# Patient Record
Sex: Male | Born: 1990 | Race: Black or African American | Hispanic: No | Marital: Single | Smoking: Current some day smoker
Health system: Southern US, Community
[De-identification: ages and names within clinical notes are randomized; demographics above are authoritative.]

---

## 2017-01-17 ENCOUNTER — Emergency Department (HOSPITAL_BASED_OUTPATIENT_CLINIC_OR_DEPARTMENT_OTHER)
Admission: EM | Admit: 2017-01-17 | Discharge: 2017-01-17 | Disposition: A | Discharge status: Home / Self Care | Payer: BLUE CROSS/BLUE SHIELD | Attending: Emergency Medicine | Admitting: Emergency Medicine

## 2017-01-17 ENCOUNTER — Encounter (HOSPITAL_BASED_OUTPATIENT_CLINIC_OR_DEPARTMENT_OTHER): Payer: Self-pay | Admitting: Emergency Medicine

## 2017-01-17 ENCOUNTER — Emergency Department (HOSPITAL_BASED_OUTPATIENT_CLINIC_OR_DEPARTMENT_OTHER): Payer: BLUE CROSS/BLUE SHIELD

## 2017-01-17 DIAGNOSIS — W19XXXA Unspecified fall, initial encounter: Secondary | ICD-10-CM | POA: Insufficient documentation

## 2017-01-17 DIAGNOSIS — S59902A Unspecified injury of left elbow, initial encounter: Secondary | ICD-10-CM | POA: Diagnosis present

## 2017-01-17 DIAGNOSIS — S5002XA Contusion of left elbow, initial encounter: Secondary | ICD-10-CM | POA: Diagnosis not present

## 2017-01-17 DIAGNOSIS — Y998 Other external cause status: Secondary | ICD-10-CM | POA: Insufficient documentation

## 2017-01-17 DIAGNOSIS — Y929 Unspecified place or not applicable: Secondary | ICD-10-CM | POA: Diagnosis not present

## 2017-01-17 DIAGNOSIS — Y9367 Activity, basketball: Secondary | ICD-10-CM | POA: Diagnosis not present

## 2017-01-17 MED ORDER — IBUPROFEN 400 MG PO TABS
600.0000 mg | ORAL_TABLET | Freq: Once | ORAL | Status: AC
  Administered 2017-01-17: 02:00:00 600 mg via ORAL
  Filled 2017-01-17: qty 1

## 2017-01-17 NOTE — ED Provider Notes (Signed)
[facility] Provider Note   CSN: [id number] Arrival date & time: 01/17/17  0038     History   Chief Complaint Chief Complaint  Patient presents with  . Elbow Injury    HPI Donald Rogers is a 26 y.o. male  with no significant past medical history presenting today with abdominal pain. Patient states last night around 6 PM he fell on his left elbow while playing well. He has pain over the lateral aspect of the elbow. He is concerned that there is a dislocation. He states he does not think it's broken because he has had broken bones before and it does not hurt that badly. He has not taken any medication for this. He denies hitting his head or loss of consciousness. There are no further complaints.  10 Systems reviewed and are negative for acute change except as noted in the HPI.    HPI  History reviewed. No pertinent past medical history.  There are no active problems to display for this patient.   History reviewed. No pertinent surgical history.     Home Medications    Prior to Admission medications   Not on File    Family History No family history on file.  Social History Social History  Substance Use Topics  . Smoking status: Never Smoker  . Smokeless tobacco: Never Used  . Alcohol use No     Allergies   Patient has no known allergies.   Review of Systems Review of Systems   Physical Exam Updated Vital Signs BP 146/98 (BP Location: Right Arm)   Pulse 64   Temp 98.2 F (36.8 C) (Oral)   Resp 16   SpO2 99%   Physical Exam  Constitutional: He is oriented to person, place, and time. Vital signs are normal. He appears well-developed and well-nourished.  Non-toxic appearance. He does not appear ill. No distress.  HENT:  Head: Normocephalic and atraumatic.  Nose: Nose normal.  Mouth/Throat: Oropharynx is clear and moist. No oropharyngeal exudate.  Eyes: Conjunctivae and EOM are normal. Pupils are equal, round, and reactive to light. No  scleral icterus.  Neck: Normal range of motion. Neck supple. No tracheal deviation, no edema, no erythema and normal range of motion present. No thyroid mass and no thyromegaly present.  Cardiovascular: Normal rate, regular rhythm, S1 normal, S2 normal, normal heart sounds, intact distal pulses and normal pulses.  Exam reveals no gallop and no friction rub.   No murmur heard. Pulmonary/Chest: Effort normal and breath sounds normal. No respiratory distress. He has no wheezes. He has no rhonchi. He has no rales.  Abdominal: Soft. Normal appearance and bowel sounds are normal. He exhibits no distension, no ascites and no mass. There is no hepatosplenomegaly. There is no tenderness. There is no rebound, no guarding and no CVA tenderness.  Musculoskeletal: He exhibits tenderness. He exhibits no edema or deformity.  Limited range of motion of left elbow due to pain. He cannot fully extend or flex. No swelling seen. No significant tenderness to palpation.  Lymphadenopathy:    He has no cervical adenopathy.  Neurological: He is alert and oriented to person, place, and time. He has normal strength. No cranial nerve deficit or sensory deficit.  Skin: Skin is warm, dry and intact. No petechiae and no rash noted. He is not diaphoretic. No erythema. No pallor.  Nursing note and vitals reviewed.    ED Treatments / Results  Labs (all labs ordered are listed, but only abnormal results are displayed)  Labs Reviewed - No data to display  EKG  EKG Interpretation None       Radiology Dg Elbow Complete Left  Result Date: 01/17/2017 CLINICAL DATA:  Basketball injury to the left elbow yesterday. Pain at the lateral aspect and over the olecranon process. Limited range of motion. EXAM: LEFT ELBOW - COMPLETE 3+ VIEW COMPARISON:  None. FINDINGS: There is no evidence of fracture, dislocation, or joint effusion. There is no evidence of arthropathy or other focal bone abnormality. Soft tissues are unremarkable.  IMPRESSION: Negative. Electronically Signed   By: Burman Nieves M.D.   On: 01/17/2017 01:17    Procedures Procedures (including critical care time)  Medications Ordered in ED Medications  ibuprofen (ADVIL,MOTRIN) tablet 600 mg (not administered)     Initial Impression / Assessment and Plan / ED Course  I have reviewed the triage vital signs and the nursing notes.  Pertinent labs & imaging results that were available during my care of the patient were reviewed by me and considered in my medical decision making (see chart for details).     Patient presents emergency department for elbow pain. X-rays negative. He was given ibuprofen and ice pack. Home care instructions provided. Likely elbow contusion. Patient advised on rest. Was given orthopedic surgery follow-up.  He appears well in no acute distress, vital signs were within his normal limits and he is safe for discharge.  Final Clinical Impressions(s) / ED Diagnoses   Final diagnoses:  Contusion of left elbow, initial encounter    New Prescriptions New Prescriptions   No medications on file     Tomasita Crumble, MD 01/17/17 [phone]

## 2017-01-17 NOTE — ED Triage Notes (Signed)
Pt c/o left elbow pain. Pt reports injury by landing on elbow on concrete while playing basketball earlier tonight.

## 2018-12-23 ENCOUNTER — Emergency Department (HOSPITAL_BASED_OUTPATIENT_CLINIC_OR_DEPARTMENT_OTHER)
Admission: EM | Admit: 2018-12-23 | Discharge: 2018-12-23 | Disposition: A | Discharge status: Home / Self Care | Payer: No Typology Code available for payment source | Attending: Emergency Medicine | Admitting: Emergency Medicine

## 2018-12-23 ENCOUNTER — Other Ambulatory Visit: Payer: Self-pay

## 2018-12-23 ENCOUNTER — Encounter (HOSPITAL_BASED_OUTPATIENT_CLINIC_OR_DEPARTMENT_OTHER): Payer: Self-pay | Admitting: *Deleted

## 2018-12-23 ENCOUNTER — Emergency Department (HOSPITAL_BASED_OUTPATIENT_CLINIC_OR_DEPARTMENT_OTHER): Payer: No Typology Code available for payment source

## 2018-12-23 DIAGNOSIS — M79671 Pain in right foot: Secondary | ICD-10-CM | POA: Insufficient documentation

## 2018-12-23 DIAGNOSIS — Y999 Unspecified external cause status: Secondary | ICD-10-CM | POA: Diagnosis not present

## 2018-12-23 DIAGNOSIS — R079 Chest pain, unspecified: Secondary | ICD-10-CM | POA: Insufficient documentation

## 2018-12-23 DIAGNOSIS — F17290 Nicotine dependence, other tobacco product, uncomplicated: Secondary | ICD-10-CM | POA: Insufficient documentation

## 2018-12-23 DIAGNOSIS — Y9389 Activity, other specified: Secondary | ICD-10-CM | POA: Diagnosis not present

## 2018-12-23 DIAGNOSIS — Y92410 Unspecified street and highway as the place of occurrence of the external cause: Secondary | ICD-10-CM | POA: Insufficient documentation

## 2018-12-23 MED ORDER — NAPROXEN 375 MG PO TABS: 375.0000 mg | ORAL_TABLET | Freq: Two times a day (BID) | ORAL | 0 refills | Status: AC

## 2018-12-23 MED ORDER — NAPROXEN 250 MG PO TABS
500.0000 mg | ORAL_TABLET | Freq: Once | ORAL | Status: AC
  Administered 2018-12-23: 500 mg via ORAL
  Filled 2018-12-23: qty 2

## 2018-12-23 MED ORDER — METHOCARBAMOL 500 MG PO TABS
1000.0000 mg | ORAL_TABLET | Freq: Once | ORAL | Status: AC
  Administered 2018-12-23: 1000 mg via ORAL
  Filled 2018-12-23: qty 2

## 2018-12-23 MED ORDER — METHOCARBAMOL 500 MG PO TABS: 500.0000 mg | ORAL_TABLET | Freq: Two times a day (BID) | ORAL | 0 refills | Status: AC

## 2018-12-23 MED ORDER — ACETAMINOPHEN 500 MG PO TABS
1000.0000 mg | ORAL_TABLET | Freq: Once | ORAL | Status: AC
  Administered 2018-12-23: 1000 mg via ORAL
  Filled 2018-12-23: qty 2
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
## 2018-12-23 NOTE — ED Notes (Signed)
ED Provider at bedside. 
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
## 2022-11-16 ENCOUNTER — Emergency Department
Admission: EM | Admit: 2022-11-16 | Discharge: 2022-11-16 | Discharge status: Against Medical Advice | Payer: Self-pay | Attending: Emergency Medicine | Admitting: Emergency Medicine

## 2022-11-16 DIAGNOSIS — I10 Essential (primary) hypertension: Secondary | ICD-10-CM | POA: Insufficient documentation

## 2022-11-16 DIAGNOSIS — Z5321 Procedure and treatment not carried out due to patient leaving prior to being seen by health care provider: Secondary | ICD-10-CM | POA: Insufficient documentation

## 2022-11-16 NOTE — ED Notes (Signed)
During the triage process pt vitals were taken. Pt saw his BP and sts " I am not staying here, my BP is not even that high." RN encouraged pt to stay. RN explained risks and benefits up to including death. Pt verbalized understanding of these risks.

## 2022-11-16 NOTE — ED Triage Notes (Signed)
Pt sts that he has been having high BP. Pt does not have a hx of this that he is aware of. Pt sts that he has been having a HA and eye pain.

## 2024-05-10 ENCOUNTER — Emergency Department: Payer: Self-pay

## 2024-05-10 ENCOUNTER — Other Ambulatory Visit: Payer: Self-pay

## 2024-05-10 ENCOUNTER — Emergency Department
Admission: EM | Admit: 2024-05-10 | Discharge: 2024-05-10 | Disposition: A | Discharge status: Home / Self Care | Payer: Self-pay | Attending: Emergency Medicine | Admitting: Emergency Medicine

## 2024-05-10 ENCOUNTER — Encounter: Payer: Self-pay | Admitting: Pharmacy Technician

## 2024-05-10 DIAGNOSIS — T40601A Poisoning by unspecified narcotics, accidental (unintentional), initial encounter: Secondary | ICD-10-CM

## 2024-05-10 DIAGNOSIS — X58XXXA Exposure to other specified factors, initial encounter: Secondary | ICD-10-CM | POA: Insufficient documentation

## 2024-05-10 DIAGNOSIS — T402X1A Poisoning by other opioids, accidental (unintentional), initial encounter: Secondary | ICD-10-CM | POA: Insufficient documentation

## 2024-05-10 DIAGNOSIS — R4182 Altered mental status, unspecified: Secondary | ICD-10-CM | POA: Insufficient documentation

## 2024-05-10 LAB — COMPREHENSIVE METABOLIC PANEL WITH GFR
ALT: 15 U/L (ref 0–44)
AST: 20 U/L (ref 15–41)
Albumin: 4.2 g/dL (ref 3.5–5.0)
Alkaline Phosphatase: 51 U/L (ref 38–126)
Anion gap: 11 (ref 5–15)
BUN: 8 mg/dL (ref 6–20)
CO2: 20 mmol/L — ABNORMAL LOW (ref 22–32)
Calcium: 8.4 mg/dL — ABNORMAL LOW (ref 8.9–10.3)
Chloride: 106 mmol/L (ref 98–111)
Creatinine, Ser: 0.91 mg/dL (ref 0.61–1.24)
GFR, Estimated: >60 mL/min (ref >60)
Glucose, Bld: 101 mg/dL — ABNORMAL HIGH (ref 70–99)
Potassium: 3.6 mmol/L (ref 3.5–5.1)
Sodium: 137 mmol/L (ref 135–145)
Total Bilirubin: 1 mg/dL (ref 0.0–1.2)
Total Protein: 8 g/dL (ref 6.5–8.1)

## 2024-05-10 LAB — CBC WITH DIFFERENTIAL/PLATELET
Abs Immature Granulocytes: 0.02 10*3/uL (ref 0.00–0.07)
Basophils Absolute: 0 10*3/uL (ref 0.0–0.1)
Basophils Relative: 0 %
Eosinophils Absolute: 0 10*3/uL (ref 0.0–0.5)
Eosinophils Relative: 0 %
HCT: 46.7 % (ref 39.0–52.0)
Hemoglobin: 15.7 g/dL (ref 13.0–17.0)
Immature Granulocytes: 0 %
Lymphocytes Relative: 22 %
Lymphs Abs: 1.4 10*3/uL (ref 0.7–4.0)
MCH: 29.6 pg (ref 26.0–34.0)
MCHC: 33.6 g/dL (ref 30.0–36.0)
MCV: 88.1 fL (ref 80.0–100.0)
Monocytes Absolute: 0.2 10*3/uL (ref 0.1–1.0)
Monocytes Relative: 3 %
Neutro Abs: 4.8 10*3/uL (ref 1.7–7.7)
Neutrophils Relative %: 75 %
Platelets: 299 10*3/uL (ref 150–400)
RBC: 5.3 MIL/uL (ref 4.22–5.81)
RDW: 13.9 % (ref 11.5–15.5)
WBC: 6.4 10*3/uL (ref 4.0–10.5)
nRBC: 0 % (ref 0.0–0.2)

## 2024-05-10 LAB — URINALYSIS, ROUTINE W REFLEX MICROSCOPIC
Glucose, UA: NEGATIVE mg/dL
Hgb urine dipstick: NEGATIVE
Ketones, ur: 5 mg/dL — AB
Nitrite: NEGATIVE
Protein, ur: 100 mg/dL — AB
Specific Gravity, Urine: 1.034 — ABNORMAL HIGH (ref 1.005–1.030)
pH: 7 (ref 5.0–8.0)

## 2024-05-10 LAB — URINE DRUG SCREEN, QUALITATIVE (ARMC ONLY)
Amphetamines, Ur Screen: NOT DETECTED
Barbiturates, Ur Screen: NOT DETECTED
Benzodiazepine, Ur Scrn: NOT DETECTED
Cannabinoid 50 Ng, Ur ~~LOC~~: NOT DETECTED
Cocaine Metabolite,Ur ~~LOC~~: NOT DETECTED
MDMA (Ecstasy)Ur Screen: NOT DETECTED
Methadone Scn, Ur: NOT DETECTED
Opiate, Ur Screen: POSITIVE — AB
Phencyclidine (PCP) Ur S: NOT DETECTED
Tricyclic, Ur Screen: NOT DETECTED

## 2024-05-10 LAB — TROPONIN I (HIGH SENSITIVITY)
Troponin I (High Sensitivity): 5 ng/L (ref <18)
Troponin I (High Sensitivity): 19 ng/L — ABNORMAL HIGH (ref <18)

## 2024-05-10 LAB — ACETAMINOPHEN LEVEL: Acetaminophen (Tylenol), Serum: 21 ug/mL (ref 10–30)

## 2024-05-10 LAB — SALICYLATE LEVEL: Salicylate Lvl: <7 mg/dL — ABNORMAL LOW (ref 7.0–30.0)

## 2024-05-10 LAB — ETHANOL: Alcohol, Ethyl (B): 44 mg/dL — ABNORMAL HIGH (ref <15)

## 2024-05-10 MED ORDER — CLONIDINE HCL 0.1 MG PO TABS: 0.1000 mg | ORAL_TABLET | Freq: Two times a day (BID) | ORAL | 0 refills | Status: AC | PRN

## 2024-05-10 MED ORDER — ONDANSETRON 8 MG PO TBDP: 8.0000 mg | ORAL_TABLET | Freq: Three times a day (TID) | ORAL | 0 refills | Status: AC | PRN

## 2024-05-10 NOTE — ED Notes (Signed)
 Patient transported to CT
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
## 2024-05-10 NOTE — ED Notes (Signed)
Pt ambulatory to the restroom without difficulty. Gait steady and even.  
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
